# Patient Record
Sex: Male | Born: 1935 | Race: White | Hispanic: No | Marital: Married | Smoking: Never smoker
Health system: Southern US, Community
[De-identification: ages and names within clinical notes are randomized; demographics above are authoritative.]

## PROBLEM LIST (undated history)

## (undated) DIAGNOSIS — C801 Malignant (primary) neoplasm, unspecified: Secondary | ICD-10-CM

## (undated) DIAGNOSIS — I10 Essential (primary) hypertension: Secondary | ICD-10-CM

## (undated) DIAGNOSIS — I4891 Unspecified atrial fibrillation: Secondary | ICD-10-CM

## (undated) HISTORY — PX: APPENDECTOMY: SHX54

## (undated) HISTORY — PX: COLON SURGERY: SHX602

---

## 2012-11-28 ENCOUNTER — Emergency Department (HOSPITAL_BASED_OUTPATIENT_CLINIC_OR_DEPARTMENT_OTHER)
Admission: EM | Admit: 2012-11-28 | Discharge: 2012-11-29 | Disposition: A | Discharge status: Other Acute Inpatient Hospital | Payer: Medicare Other | Attending: Emergency Medicine | Admitting: Emergency Medicine

## 2012-11-28 ENCOUNTER — Emergency Department (HOSPITAL_BASED_OUTPATIENT_CLINIC_OR_DEPARTMENT_OTHER): Payer: Medicare Other

## 2012-11-28 ENCOUNTER — Encounter (HOSPITAL_BASED_OUTPATIENT_CLINIC_OR_DEPARTMENT_OTHER): Payer: Self-pay | Admitting: *Deleted

## 2012-11-28 DIAGNOSIS — I4891 Unspecified atrial fibrillation: Secondary | ICD-10-CM | POA: Insufficient documentation

## 2012-11-28 DIAGNOSIS — G459 Transient cerebral ischemic attack, unspecified: Secondary | ICD-10-CM

## 2012-11-28 DIAGNOSIS — I10 Essential (primary) hypertension: Secondary | ICD-10-CM | POA: Insufficient documentation

## 2012-11-28 DIAGNOSIS — Z79899 Other long term (current) drug therapy: Secondary | ICD-10-CM | POA: Insufficient documentation

## 2012-11-28 DIAGNOSIS — Z8589 Personal history of malignant neoplasm of other organs and systems: Secondary | ICD-10-CM | POA: Insufficient documentation

## 2012-11-28 HISTORY — DX: Unspecified atrial fibrillation: I48.91

## 2012-11-28 HISTORY — DX: Essential (primary) hypertension: I10

## 2012-11-28 HISTORY — DX: Malignant (primary) neoplasm, unspecified: C80.1

## 2012-11-28 LAB — CBC WITH DIFFERENTIAL/PLATELET
Basophils Absolute: 0 10*3/uL (ref 0.0–0.1)
HCT: 41.5 % (ref 39.0–52.0)
Hemoglobin: 14.1 g/dL (ref 13.0–17.0)
Lymphocytes Relative: 13 % (ref 12–46)
Lymphs Abs: 1.4 10*3/uL (ref 0.7–4.0)
Monocytes Absolute: 0.9 10*3/uL (ref 0.1–1.0)
Monocytes Relative: 8 % (ref 3–12)
Neutro Abs: 8.6 10*3/uL — ABNORMAL HIGH (ref 1.7–7.7)
RBC: 4.56 MIL/uL (ref 4.22–5.81)
WBC: 11.1 10*3/uL — ABNORMAL HIGH (ref 4.0–10.5)

## 2012-11-28 LAB — BASIC METABOLIC PANEL
BUN: 17 mg/dL (ref 6–23)
CO2: 26 mEq/L (ref 19–32)
Chloride: 97 mEq/L (ref 96–112)
Creatinine, Ser: 0.6 mg/dL (ref 0.50–1.35)
Glucose, Bld: 103 mg/dL — ABNORMAL HIGH (ref 70–99)

## 2012-11-28 LAB — PROTIME-INR: Prothrombin Time: 15.4 seconds — ABNORMAL HIGH (ref 11.6–15.2)

## 2012-11-28 MED ORDER — ENOXAPARIN SODIUM 80 MG/0.8ML ~~LOC~~ SOLN
1.0000 mg/kg | Freq: Once | SUBCUTANEOUS | Status: AC
  Administered 2012-11-28: 80 mg via SUBCUTANEOUS
  Filled 2012-11-28: qty 0.8

## 2012-11-28 MED ORDER — ASPIRIN 81 MG PO CHEW
324.0000 mg | CHEWABLE_TABLET | Freq: Once | ORAL | Status: AC
  Administered 2012-11-28: 324 mg via ORAL
  Filled 2012-11-28: qty 4

## 2012-11-28 NOTE — ED Notes (Signed)
Dr. Rulon Abide is in room speaking with the Pt. And the Pt. Daughter.  Pt. Is in no distress with controlled A Fib and stable VS.

## 2012-11-28 NOTE — ED Notes (Signed)
Pt. Has no neuro deficits and is alert and oriented. Pt. Speaking with Dr. At present time.

## 2012-11-28 NOTE — ED Notes (Signed)
Pt. Is 20/70 in R and L eye with out glasses.  Pt. Is 20/70 in R eye with glasses and is 20/50 in the L eye with no glasses.  Pt. Reports he had some trouble with reading his mail this morning. Pt. Daughter at bedside making the Pt. Angry.   Pt. Does live alone with reports that he does well.

## 2012-11-28 NOTE — ED Notes (Signed)
MD at bedside. 

## 2012-11-28 NOTE — ED Notes (Signed)
Family at bedside. 

## 2012-11-28 NOTE — ED Notes (Signed)
EDP Bonk is talking with physicians trying to get the Pt. Transferred to [facility] for further care due to Pt. Wants to go to [facility] and reports his Drs are there.

## 2012-11-28 NOTE — ED Notes (Signed)
Was taken off Coumadin for 2 days to get an injection for a pinched nerve. Since restarting his Coumadin, he has had visual disturbances and a slight headache.

## 2012-11-28 NOTE — ED Provider Notes (Signed)
History     CSN: [id number]  Arrival date & time 11/28/12  1749   First MD Initiated Contact with Patient 11/28/12 1822      No chief complaint on file.   (Consider location/radiation/quality/duration/timing/severity/associated sxs/prior treatment) HPIBennie Adkins is a 78 y.o. male was taking Coumadin for atrial fibrillation who recently discontinued taking Coumadin because he had an epidural nerve block placed on the 18th for chronic back pain. Patient was not bridged with Lovenox, but just started taking his Coumadin 2 days ago. He had a total of 5 days off of Coumadin. The patient presents because today he experienced transient right sided vision loss. He picked up some letters out of the mailbox tried to read one and noted that he could not read the right side, he cover both of his eyes and had the same symptoms bilaterally. He denies any dysarthria, numbness or tingling, weakness. These symptoms of vision loss resolved spontaneously. They occurred a few times throughout the day. No history of stroke. Denies any chest pain, shortness of breath, abdominal pain, nausea vomiting or diarrhea, no fevers or chills, rash, arthralgias or myalgias. No recent illness  Past Medical History  Diagnosis Date  . Atrial fibrillation   . Hypertension   . Cancer     Past Surgical History  Procedure Date  . Colon surgery   . Appendectomy     No family history on file.  History  Substance Use Topics  . Smoking status: Never Smoker   . Smokeless tobacco: Not on file  . Alcohol Use: No      Review of Systems At least 10pt or greater review of systems completed and are negative except where specified in the HPI.  Allergies  Sulfa antibiotics  Home Medications   Current Outpatient Rx  Name  Route  Sig  Dispense  Refill  . ALLOPURINOL PO   Oral   Take by mouth.         Marland Kitchen LISINOPRIL PO   Oral   Take by mouth.         . METOPROLOL TARTRATE PO   Oral   Take by mouth.          . COUMADIN PO   Oral   Take by mouth.           BP 162/110  Pulse 89  Temp 98.2 F (36.8 C) (Oral)  Resp 20  SpO2 97%  Physical Exam  PHYSICAL EXAM: VITAL SIGNS:  . Filed Vitals:   11/28/12 1926 11/28/12 1927 11/28/12 2049 11/28/12 2354  BP: 170/88  166/78   Pulse: 80     Temp:    98.4 F (36.9 C)  TempSrc:      Resp: 16  16   Height:  5\' 10"  (1.778 m)    Weight:  176 lb (79.833 kg)    SpO2: 100%  100%    CONSTITUTIONAL: Awake, oriented, appears non-toxic HENT: Atraumatic, normocephalic, oral mucosa pink and moist, airway patent. Nares patent without drainage. External ears normal. EYES: Conjunctiva clear, EOMI, PERRLA NECK: Trachea midline, non-tender, supple CARDIOVASCULAR: Normal heart rate, Normal rhythm, No murmurs, rubs, gallops PULMONARY/CHEST: Clear to auscultation, no rhonchi, wheezes, or rales. Symmetrical breath sounds. CHEST WALL: No lesions. Non-tender. ABDOMINAL: Non-distended, soft, non-tender - no rebound or guarding.  BS normal. NEUROLOGIC: EN:IDPOEU fields intact. PERRLA, EOMI.  Facial sensation equal to light touch bilaterally.  Good muscle bulk in the masseter muscle and good lateral movement of the jaw.  Facial expressions  equal and good strength with smile/frown and puffed cheeks.  Hearing grossly intact to finger rub test.  Uvula, tongue are midline with no deviation. Symmetrical palate elevation.  Trapezius and SCM muscles are 5/5 strength bilaterally.   DTR: Brachioradialis, biceps, patellar, Achilles tendon reflexes 2+ bilaterally.  No clonus. Strength: 5/5 strength flexors and extensors in the upper and lower extremities.  Grip strength, finger adduction/abduction 5/5. Sensation: Sensation intact distally to light touch Cerebellar: No ataxia with walking or dysmetria with finger to nose, rapid alternating hand movements and heels to shin testing. EXTREMITIES: No clubbing, cyanosis, or edema SKIN: Warm, Dry, No erythema, No rash. Innumerable  actinic keratoses   ED Course  Procedures (including critical care time)  Labs Reviewed  CBC WITH DIFFERENTIAL - Abnormal; Notable for the following:    WBC 11.1 (*)     Neutrophils Relative 78 (*)     Neutro Abs 8.6 (*)     All other components within normal limits  BASIC METABOLIC PANEL - Abnormal; Notable for the following:    Glucose, Bld 103 (*)     GFR calc non Af Amer 90 (*)     All other components within normal limits  PROTIME-INR - Abnormal; Notable for the following:    Prothrombin Time 15.4 (*)     All other components within normal limits   Ct Head Wo Contrast  11/28/2012  *RADIOLOGY REPORT*  Clinical Data: Amaurosis fugax.  Left-sided headaches. Hypertension.  CT HEAD WITHOUT CONTRAST  Technique:  Contiguous axial images were obtained from the base of the skull through the vertex without contrast.  Comparison: None available.  Findings: Moderate generalized atrophy is present.  Mild generalized periventricular and subcortical white matter hypoattenuation is present bilaterally.  No acute cortical infarct, hemorrhage, mass lesion is present.  The ventricles are proportionate to the degree of atrophy.  No significant extra-axial fluid collection is present.  A heterogeneous mass lesion expands the left maxillary sinus extends into the alveolar ridge.  The paranasal sinuses and mastoid air cells are otherwise clear.  The osseous skull is intact. The extracranial soft tissues are within normal limits.  IMPRESSION:  1.  Moderate generalized atrophy and white matter disease.  This likely reflects the sequelae of chronic microvascular ischemia. 2.  No acute intracranial abnormality. 3.  Expansile heterogeneous calcified mass within the left maxillary sinus.  This is most compatible with a mucocele.  The lesion extends into the alveolar ridge of the left maxilla.   Original Report Authenticated By: Marin Roberts, M.D.      1. TIA (transient ischemic attack)       MDM   Oscar Adkins is a 78 y.o. male presenting with history suggestive of transient ischemic attacks. Patient worked up as such-patient is subtherapeutic on his Coumadin with an INR of 1.24. Patient was given aspirin in the emergency department and will be given Lovenox as well. Otherwise labs are unremarkable. CT is unremarkable for acute hemorrhage or intracranial abnormality. Patient is neurologically intact at this time neurovascularly stable. Discussed with Dr. Lowell Guitar in [facility] for transfer for workup of his TIAs. Patient is likely in a transient hypercoagulable state and do to Coumadin therapy.  Patient is stable for transfer.     Jones Skene, MD 11/29/12 [phone]

## 2012-11-28 NOTE — ED Notes (Signed)
Pt. Daughter at bedside saying "We need to get this show on the road".  Explained to Pt and Pt. Daughter that we are waiting on HP Dr. To return our call.  Pt in no distress.

## 2013-12-18 ENCOUNTER — Emergency Department (HOSPITAL_BASED_OUTPATIENT_CLINIC_OR_DEPARTMENT_OTHER)
Admission: EM | Admit: 2013-12-18 | Discharge: 2013-12-18 | Disposition: A | Discharge status: Home / Self Care | Payer: Medicare Other | Attending: Emergency Medicine | Admitting: Emergency Medicine

## 2013-12-18 ENCOUNTER — Emergency Department (HOSPITAL_BASED_OUTPATIENT_CLINIC_OR_DEPARTMENT_OTHER): Payer: Medicare Other

## 2013-12-18 ENCOUNTER — Encounter (HOSPITAL_BASED_OUTPATIENT_CLINIC_OR_DEPARTMENT_OTHER): Payer: Self-pay | Admitting: Emergency Medicine

## 2013-12-18 DIAGNOSIS — I10 Essential (primary) hypertension: Secondary | ICD-10-CM | POA: Insufficient documentation

## 2013-12-18 DIAGNOSIS — Z8709 Personal history of other diseases of the respiratory system: Secondary | ICD-10-CM | POA: Insufficient documentation

## 2013-12-18 DIAGNOSIS — I4891 Unspecified atrial fibrillation: Secondary | ICD-10-CM | POA: Insufficient documentation

## 2013-12-18 DIAGNOSIS — IMO0001 Reserved for inherently not codable concepts without codable children: Secondary | ICD-10-CM | POA: Insufficient documentation

## 2013-12-18 DIAGNOSIS — R531 Weakness: Secondary | ICD-10-CM

## 2013-12-18 DIAGNOSIS — R5381 Other malaise: Secondary | ICD-10-CM | POA: Insufficient documentation

## 2013-12-18 DIAGNOSIS — R51 Headache: Secondary | ICD-10-CM | POA: Insufficient documentation

## 2013-12-18 DIAGNOSIS — Z79899 Other long term (current) drug therapy: Secondary | ICD-10-CM | POA: Insufficient documentation

## 2013-12-18 DIAGNOSIS — Z859 Personal history of malignant neoplasm, unspecified: Secondary | ICD-10-CM | POA: Insufficient documentation

## 2013-12-18 DIAGNOSIS — IMO0002 Reserved for concepts with insufficient information to code with codable children: Secondary | ICD-10-CM | POA: Insufficient documentation

## 2013-12-18 DIAGNOSIS — Z7901 Long term (current) use of anticoagulants: Secondary | ICD-10-CM | POA: Insufficient documentation

## 2013-12-18 DIAGNOSIS — I959 Hypotension, unspecified: Secondary | ICD-10-CM

## 2013-12-18 DIAGNOSIS — I951 Orthostatic hypotension: Secondary | ICD-10-CM | POA: Insufficient documentation

## 2013-12-18 DIAGNOSIS — Z880 Allergy status to penicillin: Secondary | ICD-10-CM | POA: Insufficient documentation

## 2013-12-18 DIAGNOSIS — R5383 Other fatigue: Secondary | ICD-10-CM

## 2013-12-18 LAB — URINALYSIS, ROUTINE W REFLEX MICROSCOPIC
BILIRUBIN URINE: NEGATIVE
GLUCOSE, UA: NEGATIVE mg/dL
HGB URINE DIPSTICK: NEGATIVE
Ketones, ur: NEGATIVE mg/dL
Leukocytes, UA: NEGATIVE
Nitrite: NEGATIVE
PROTEIN: NEGATIVE mg/dL
Specific Gravity, Urine: 1.009 (ref 1.005–1.030)
Urobilinogen, UA: 0.2 mg/dL (ref 0.0–1.0)
pH: 5.5 (ref 5.0–8.0)

## 2013-12-18 LAB — BASIC METABOLIC PANEL
BUN: 60 mg/dL — AB (ref 6–23)
CALCIUM: 9.2 mg/dL (ref 8.4–10.5)
CO2: 36 meq/L — AB (ref 19–32)
Chloride: 92 mEq/L — ABNORMAL LOW (ref 96–112)
Creatinine, Ser: 1.3 mg/dL (ref 0.50–1.35)
GFR calc Af Amer: 56 mL/min — ABNORMAL LOW (ref >90)
GFR calc non Af Amer: 48 mL/min — ABNORMAL LOW (ref >90)
GLUCOSE: 111 mg/dL — AB (ref 70–99)
Potassium: 4.9 mEq/L (ref 3.7–5.3)
SODIUM: 138 meq/L (ref 137–147)

## 2013-12-18 LAB — CBC WITH DIFFERENTIAL/PLATELET
BASOS PCT: 1 % (ref 0–1)
Basophils Absolute: 0.1 10*3/uL (ref 0.0–0.1)
EOS ABS: 0.1 10*3/uL (ref 0.0–0.7)
Eosinophils Relative: 1 % (ref 0–5)
HEMATOCRIT: 34.9 % — AB (ref 39.0–52.0)
HEMOGLOBIN: 10.8 g/dL — AB (ref 13.0–17.0)
LYMPHS ABS: 1.6 10*3/uL (ref 0.7–4.0)
LYMPHS PCT: 17 % (ref 12–46)
MCH: 27.2 pg (ref 26.0–34.0)
MCHC: 30.9 g/dL (ref 30.0–36.0)
MCV: 87.9 fL (ref 78.0–100.0)
MONO ABS: 1.2 10*3/uL — AB (ref 0.1–1.0)
Monocytes Relative: 13 % — ABNORMAL HIGH (ref 3–12)
NEUTROS ABS: 6.4 10*3/uL (ref 1.7–7.7)
Neutrophils Relative %: 68 % (ref 43–77)
Platelets: 234 10*3/uL (ref 150–400)
RBC: 3.97 MIL/uL — ABNORMAL LOW (ref 4.22–5.81)
RDW: 18 % — AB (ref 11.5–15.5)
WBC: 9.4 10*3/uL (ref 4.0–10.5)

## 2013-12-18 LAB — DIGOXIN LEVEL: DIGOXIN LVL: 0.6 ng/mL — AB (ref 0.8–2.0)

## 2013-12-18 LAB — TROPONIN I: Troponin I: <0.3 ng/mL (ref <0.30)

## 2013-12-18 LAB — PROTIME-INR
INR: 1.25 (ref 0.00–1.49)
PROTHROMBIN TIME: 15.4 s — AB (ref 11.6–15.2)

## 2013-12-18 LAB — OCCULT BLOOD X 1 CARD TO LAB, STOOL: Fecal Occult Bld: NEGATIVE

## 2013-12-18 MED ORDER — SODIUM CHLORIDE 0.9 % IV BOLUS (SEPSIS)
500.0000 mL | Freq: Once | INTRAVENOUS | Status: AC
  Administered 2013-12-18: 500 mL via INTRAVENOUS

## 2013-12-18 NOTE — ED Provider Notes (Signed)
CSN: [id number]     Arrival date & time 12/18/13  1505 History   First MD Initiated Contact with Patient 12/18/13 1510     Chief Complaint  Patient presents with  . Hypotension   (Consider location/radiation/quality/duration/timing/severity/associated sxs/prior Treatment) HPI Patient with history of afib living in LTCF reports several days of intermittent headache and general weakness. Found to by hypotensive at facility yesterday which was persistent today. No fever. No falls or injuries. Pt reports vague mild aching pain 'all over' but no specific chest pain or SOB. He was recently treated for Bronchitis. Denies any vomiting, diarrhea or dysuria. No bloody or melanic stools.   Past Medical History  Diagnosis Date  . Atrial fibrillation   . Hypertension   . Cancer    Past Surgical History  Procedure Laterality Date  . Colon surgery    . Appendectomy     History reviewed. No pertinent family history. History  Substance Use Topics  . Smoking status: Never Smoker   . Smokeless tobacco: Not on file  . Alcohol Use: No    Review of Systems All other systems reviewed and are negative except as noted in HPI.   Allergies  Codeine; Erythromycin; Penicillins; and Sulfa antibiotics  Home Medications   Current Outpatient Rx  Name  Route  Sig  Dispense  Refill  . digoxin (LANOXIN) 0.125 MG tablet   Oral   Take by mouth daily.         Marland Kitchen escitalopram (LEXAPRO) 20 MG tablet   Oral   Take 20 mg by mouth daily.         . finasteride (PROSCAR) 5 MG tablet   Oral   Take 5 mg by mouth daily.         . fluticasone (FLOVENT HFA) 110 MCG/ACT inhaler   Inhalation   Inhale into the lungs 2 (two) times daily.         . furosemide (LASIX) 80 MG tablet   Oral   Take 80 mg by mouth.         . hydrocortisone (ANUSOL-HC) 2.5 % rectal cream   Rectal   Place 1 application rectally 2 (two) times daily.         . isosorbide mononitrate (IMDUR) 60 MG 24 hr tablet   Oral   Take  60 mg by mouth daily.         Marland Kitchen loratadine (CLARITIN) 10 MG tablet   Oral   Take 10 mg by mouth daily.         Marland Kitchen LORazepam (ATIVAN) 1 MG tablet   Oral   Take 1 mg by mouth every 8 (eight) hours.         . metolazone (ZAROXOLYN) 2.5 MG tablet   Oral   Take 2.5 mg by mouth daily.         . potassium chloride (K-DUR) 10 MEQ tablet   Oral   Take 10 mEq by mouth daily.         . Rivaroxaban (XARELTO) 15 MG TABS tablet   Oral   Take 15 mg by mouth 2 (two) times daily with a meal.         . spironolactone (ALDACTONE) 25 MG tablet   Oral   Take 25 mg by mouth daily.         . ALLOPURINOL PO   Oral   Take by mouth.         Marland Kitchen LISINOPRIL PO   Oral  Take by mouth.         . METOPROLOL TARTRATE PO   Oral   Take by mouth.         . Warfarin Sodium (COUMADIN PO)   Oral   Take by mouth.          BP 103/58  Pulse 82  Temp(Src) 97.9 F (36.6 C) (Oral)  Resp 22  Ht 6' (1.829 m)  Wt 200 lb (90.719 kg)  BMI 27.12 kg/m2  SpO2 95% Physical Exam  Nursing note and vitals reviewed. Constitutional: He is oriented to person, place, and time. He appears well-developed and well-nourished.  HENT:  Head: Normocephalic and atraumatic.  Eyes: EOM are normal. Pupils are equal, round, and reactive to light.  Neck: Normal range of motion. Neck supple.  Cardiovascular: Normal rate, normal heart sounds and intact distal pulses.   Pulmonary/Chest: Effort normal and breath sounds normal.  Abdominal: Bowel sounds are normal. He exhibits no distension. There is no tenderness.  Musculoskeletal: Normal range of motion. He exhibits no edema and no tenderness.  Neurological: He is alert and oriented to person, place, and time. He has normal strength. No cranial nerve deficit or sensory deficit.  Skin: Skin is warm and dry. No rash noted.  Psychiatric: He has a normal mood and affect.    ED Course  Procedures (including critical care time) Labs Review Labs Reviewed  CBC  WITH DIFFERENTIAL - Abnormal; Notable for the following:    RBC 3.97 (*)    Hemoglobin 10.8 (*)    HCT 34.9 (*)    RDW 18.0 (*)    Monocytes Relative 13 (*)    Monocytes Absolute 1.2 (*)    All other components within normal limits  BASIC METABOLIC PANEL - Abnormal; Notable for the following:    Chloride 92 (*)    CO2 36 (*)    Glucose, Bld 111 (*)    BUN 60 (*)    GFR calc non Af Amer 48 (*)    GFR calc Af Amer 56 (*)    All other components within normal limits  PROTIME-INR - Abnormal; Notable for the following:    Prothrombin Time 15.4 (*)    All other components within normal limits  DIGOXIN LEVEL - Abnormal; Notable for the following:    Digoxin Level 0.6 (*)    All other components within normal limits  URINALYSIS, ROUTINE W REFLEX MICROSCOPIC  TROPONIN I  OCCULT BLOOD X 1 CARD TO LAB, STOOL   Imaging Review Ct Head Wo Contrast  12/18/2013   CLINICAL DATA:  Headache, weakness, hypotension  EXAM: CT HEAD WITHOUT CONTRAST  TECHNIQUE: Contiguous axial images were obtained from the base of the skull through the vertex without intravenous contrast.  COMPARISON:  08/21/2013  FINDINGS: No skull fracture is noted. Again noted partition inferior aspect of the left maxillary sinus with air-fluid level. This is unchanged from prior exam. The mastoid air cells are unremarkable.  Stable cerebral atrophy. Stable periventricular and patchy subcortical chronic white matter disease. No acute cortical infarction. No mass lesion is noted on this unenhanced scan.  No intracranial hemorrhage, mass effect or midline shift.  IMPRESSION: No acute intracranial abnormality. Stable atrophy and chronic white matter disease.   Electronically Signed   By: Natasha Mead M.D.   On: 12/18/2013 16:03    EKG Interpretation   None       MDM   1. General weakness   2. Hypotension     Labs  and imaging reviewed. Decrease in Hgb from Dec2013 but family states he has had anemia more recently than that. Hemoccult  neg. BP improved with 500cc IVF bolus. Will ambulate and recheck.   Pt walks without difficulty, feeling better. No signs of acute blood loss, sepsis or other source of shock. Advised to hold lisinopril until PCP followup next week.    Archit Leger B. Karle Starch, MD 12/18/13 [phone]

## 2013-12-18 NOTE — ED Notes (Signed)
Brought in by EMS from [facility] for low BP and generalized weakness

## 2013-12-18 NOTE — ED Notes (Signed)
MD at bedside. 

## 2013-12-18 NOTE — Discharge Instructions (Signed)
Hypotension As your heart beats, it forces blood through your arteries. This force is your blood pressure. If your blood pressure is too low for you to go about your normal activities or support the organs of your body, you have hypotension, or low blood pressure. When your blood pressure becomes too low, you may not get enough blood to your brain, and you may feel weak, lightheaded, or develop a more rapid heart rate. In a more severe case, you may faint: this is a sudden, brief loss of consciousness where you pass out and recover completely. CAUSES  Loss of blood or fluids from the body. This occurs during rapid blood loss. It can also come from dehydration when the body is not taking in enough fluids or is losing fluids faster than they can be replaced. Examples of this would be severe vomiting and diarrhea.  Not taking in enough fluids and salts. This is common in the elderly where thirst mechanisms are not working as well. This means you do not feel thirsty and you do not take in enough water.  Use of blood pressure pills and other medications that may lower the blood pressure below normal.  Over medication (always take your medications as directed).  Irregular heart beat or heart failure when the heart is no longer working well enough to support blood pressure. Hospitalization is sometimes required for low blood pressure if fluid or blood replacement is needed, if time is needed for medications to wear off, or if further evaluation is needed. Less common causes of low blood pressure might include peripheral or autonomic neuropathy (nerve problems), Parkinson's disease, or other illnesses. Treatment might include a change in diet, change in medications (including medicines aimed at raising your blood pressure), and use of support stockings. HOME CARE INSTRUCTIONS   Maintain good fluid intake and use a little more salt on your food (if you are not on a restricted diet or having problems with your  heart such as heart failure). This is especially important for the elderly when you may not feel thirsty in the winter.  Take your medications as directed.  Get up slowly from reclining or sitting positions. This gives your blood pressure a chance to adjust. As we grow older our ability to regulate our blood pressure may not be as good as when we were younger.  Wear support stockings if prescribed.  Use walkers, canes, etc., if advised.  Talk with your physician or nurse about a Home Safety Evaluation (usually done by visiting nurses). SEEK IMMEDIATE MEDICAL CARE IF:   You have a fainting episode. Do not drive yourself. Call 911 if no other help is available.  You have chest pain, nausea (feeling sick to your stomach) or vomiting.  You have a loss of feeling in some part of your body, or lose movement in your arms or legs.  You have difficulty with speech.  You become sweaty and/or feel light headed. Make sure you are re-checked as instructed. MAKE SURE YOU:   Understand these instructions.  Will watch your condition.  Will get help right away if you are not doing well or get worse. Document Released: 11/26/2005 Document Revised: 02/18/2012 Document Reviewed: 05/29/2013 [employer] Patient Information 2014 [employer].  

## 2014-04-06 IMAGING — CT CT HEAD W/O CM
1 series · 15 of 30 positions shown, 19 images · non-contrast
Comparison: None available.

CLINICAL DATA: Amaurosis fugax.  Left-sided headaches.
Hypertension.

CT HEAD WITHOUT CONTRAST
TECHNIQUE: Contiguous axial images were obtained from the base of
the skull through the vertex without contrast.

[Series 2: head 4.8 h37s · axial · 0.46mm/px · z∈[-162,-6]mm · 15 of 36 slices shown, 19 images]
[im 2/36  brain]
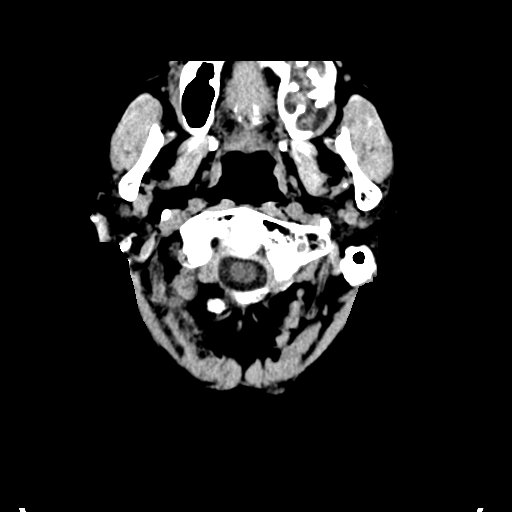
[im 2/36  bone]
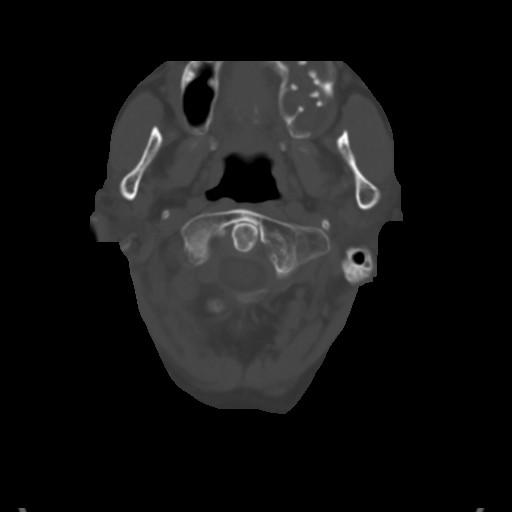
[im 4/36  brain]
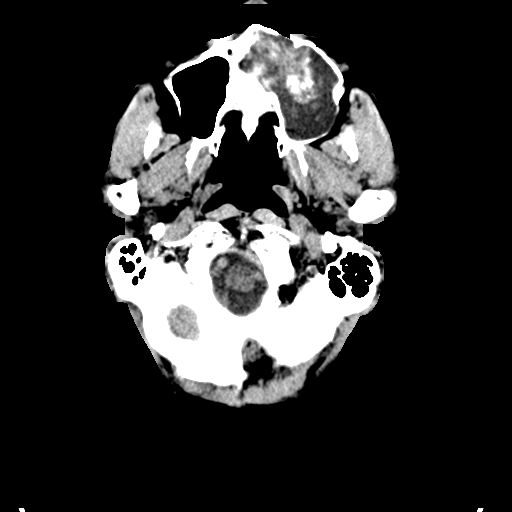
[im 7/36  brain]
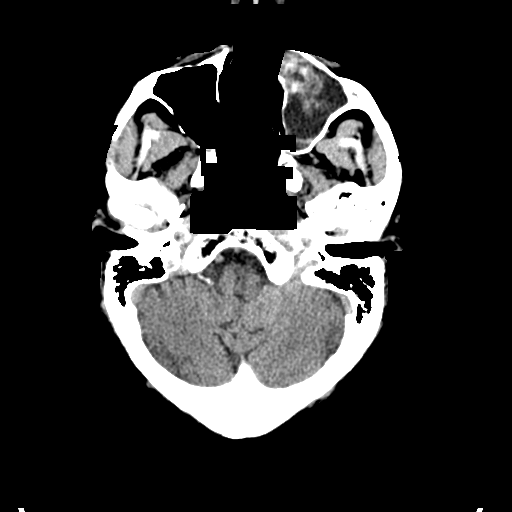
[im 9/36  brain]
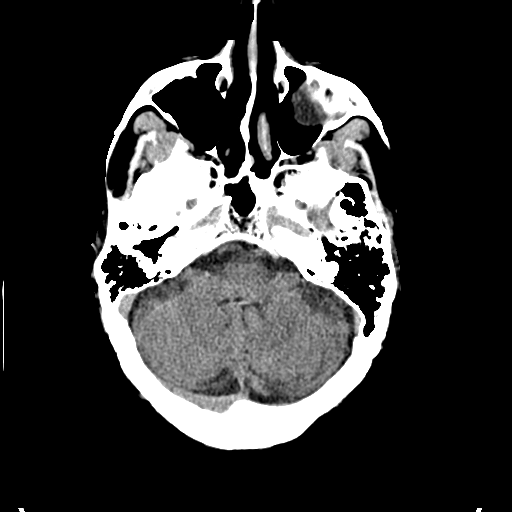
[im 11/36  brain]
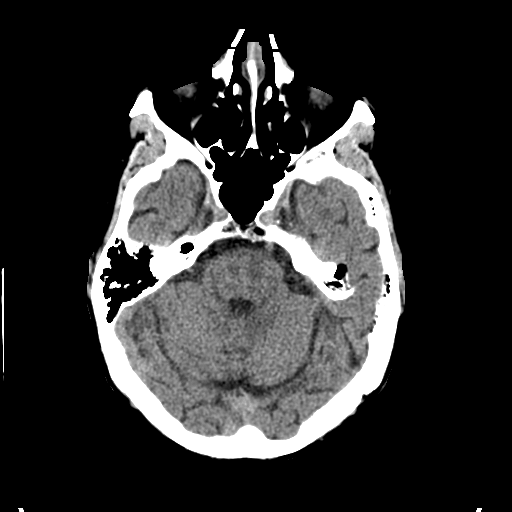
[im 11/36  bone]
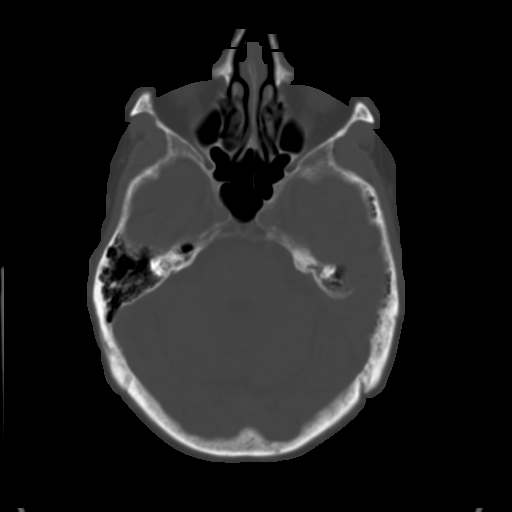
[im 14/36  brain]
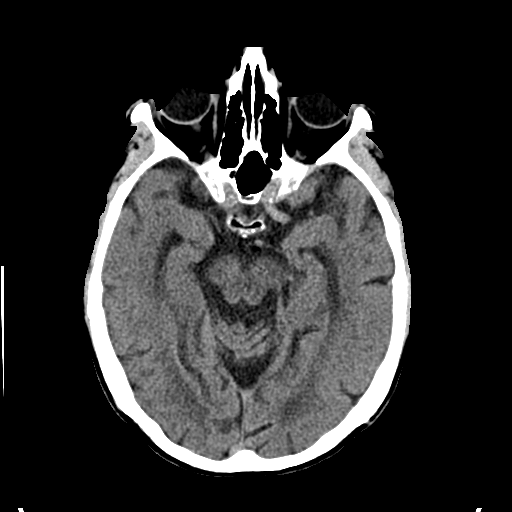
[im 16/36  brain]
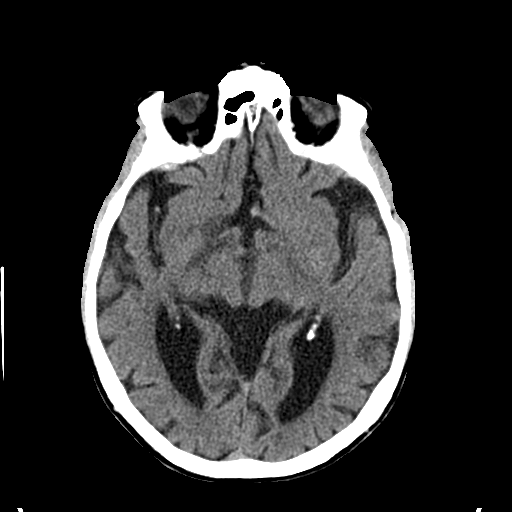
[im 19/36  brain]
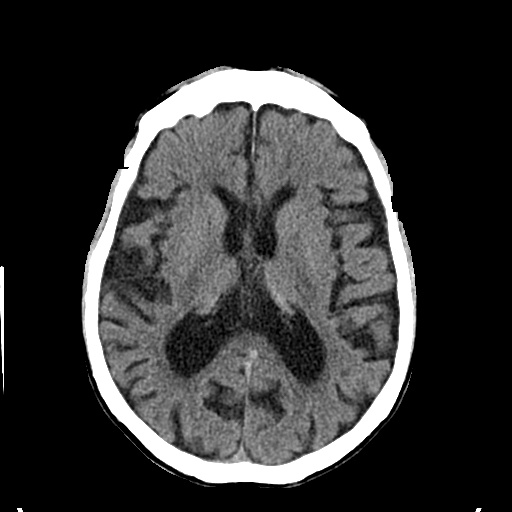
[im 20/36  brain]
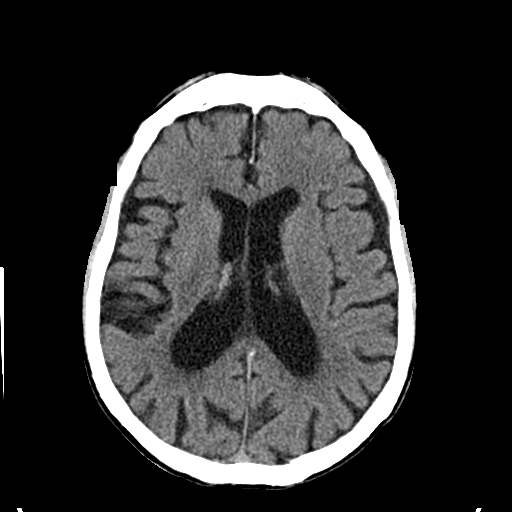
[im 20/36  bone]
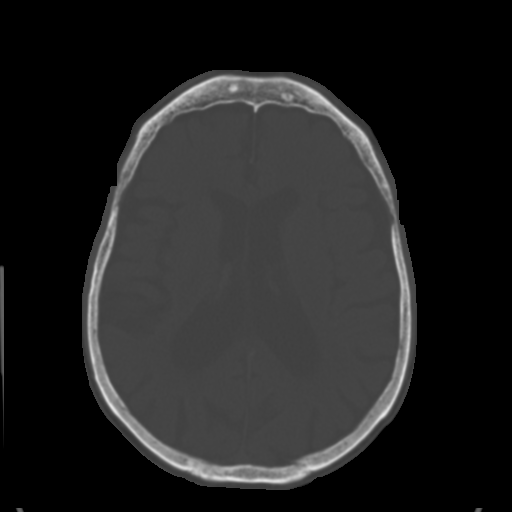
[im 22/36  brain]
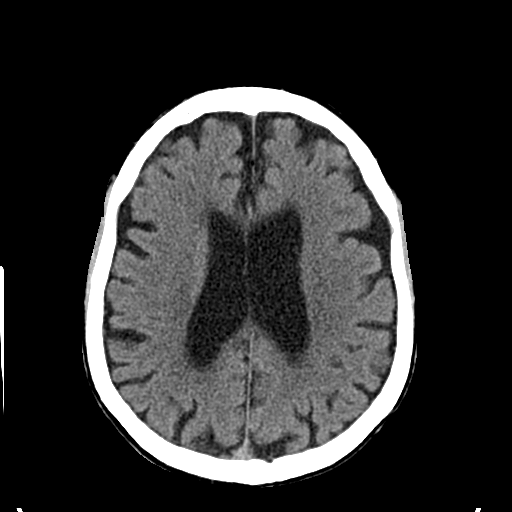
[im 25/36  brain]
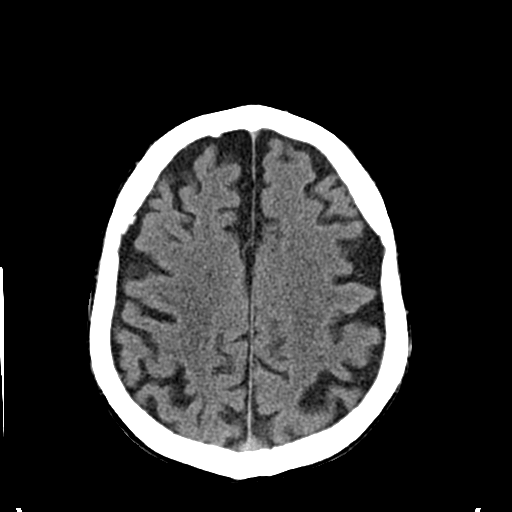
[im 27/36  brain]
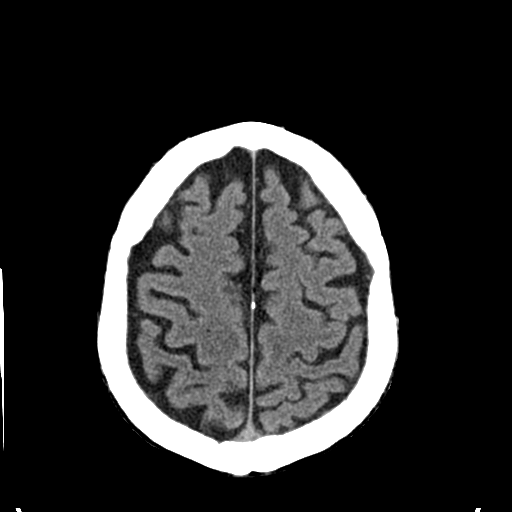
[im 29/36  brain]
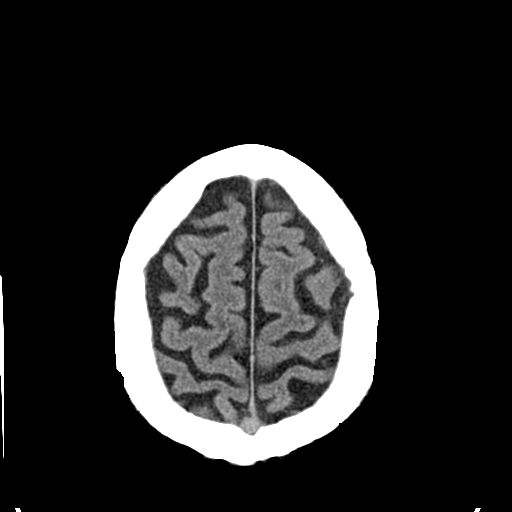
[im 29/36  bone]
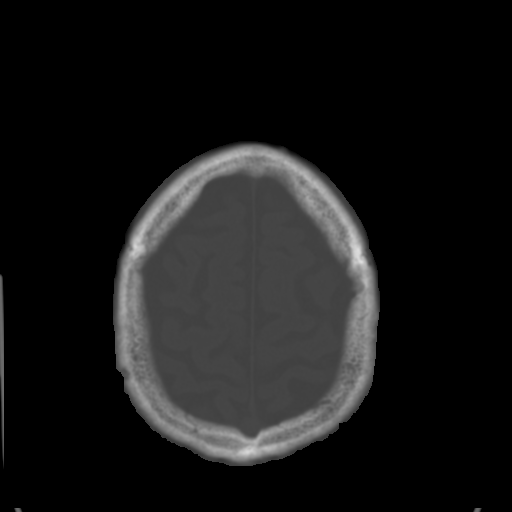
[im 32/36  brain]
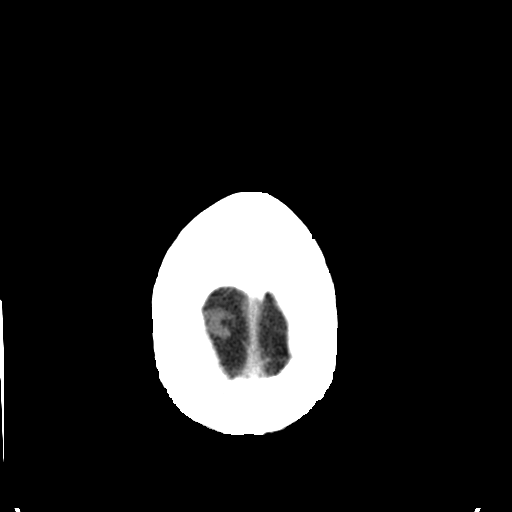
[im 34/36  brain]
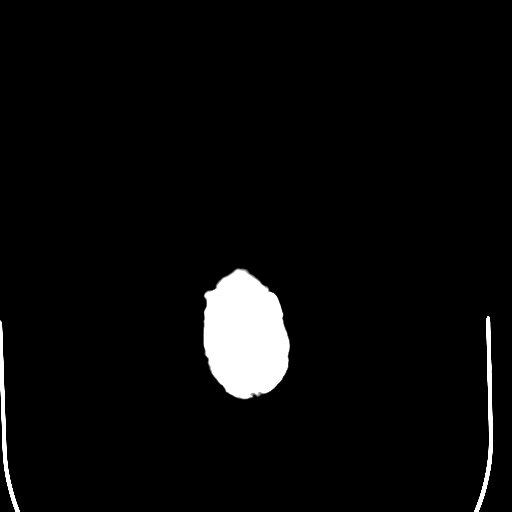

[15 of 30 positions shown; findings below may reference images not displayed]

FINDINGS: Moderate generalized atrophy is present.  Mild
generalized periventricular and subcortical white matter
hypoattenuation is present bilaterally.  No acute cortical infarct,
hemorrhage, mass lesion is present.  The ventricles are
proportionate to the degree of atrophy.  No significant extra-axial
fluid collection is present.

A heterogeneous mass lesion expands the left maxillary sinus
extends into the alveolar ridge.  The paranasal sinuses and mastoid
air cells are otherwise clear.  The osseous skull is intact. The
extracranial soft tissues are within normal limits.
IMPRESSION: 1.  Moderate generalized atrophy and white matter disease.  This
likely reflects the sequelae of chronic microvascular ischemia.
2.  No acute intracranial abnormality.
3.  Expansile heterogeneous calcified mass within the left
maxillary sinus.  This is most compatible with a mucocele.  The
lesion extends into the alveolar ridge of the left maxilla.

## 2014-04-09 DEATH — deceased

## 2015-04-26 IMAGING — CT CT HEAD W/O CM
1 series · 16 of 30 positions shown, 20 images · non-contrast
Comparison: 08/21/2013

CLINICAL DATA: Headache, weakness, hypotension

EXAM:
CT HEAD WITHOUT CONTRAST
TECHNIQUE: Contiguous axial images were obtained from the base of the skull
through the vertex without intravenous contrast.

[Series 2: head 4.8 h37s · axial · 0.45mm/px · z∈[-162,-6]mm · 16 of 36 slices shown, 20 images]
[im 2/36  brain]
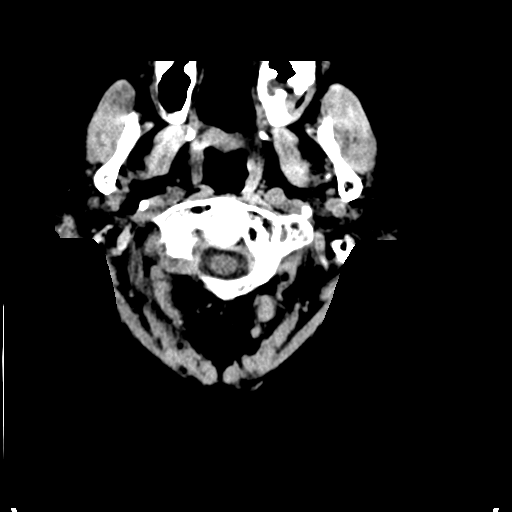
[im 2/36  bone]
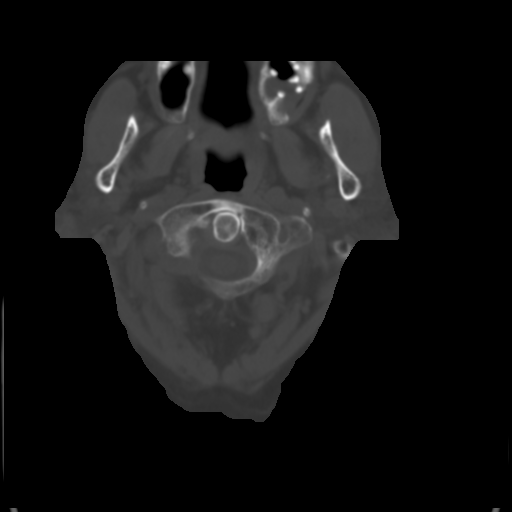
[im 4/36  brain]
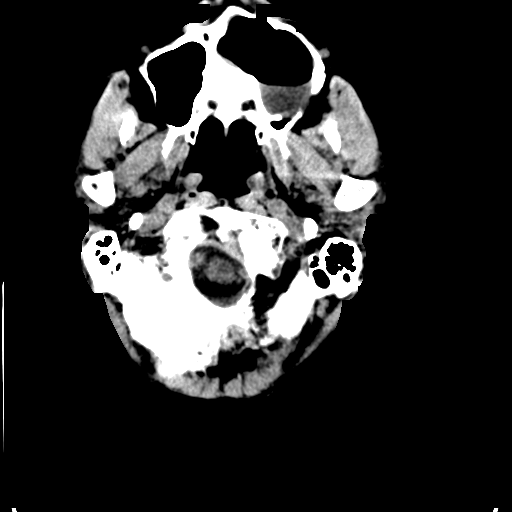
[im 7/36  brain]
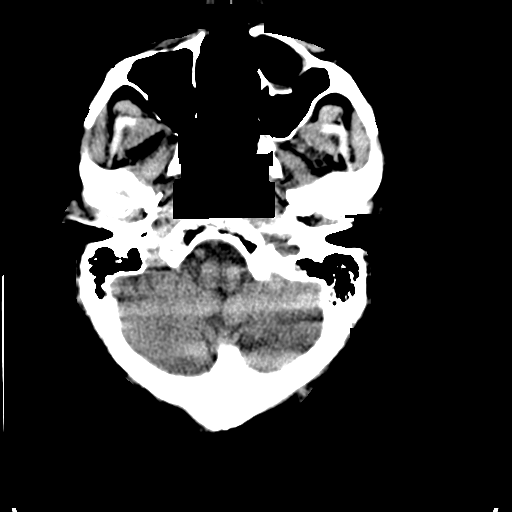
[im 9/36  brain]
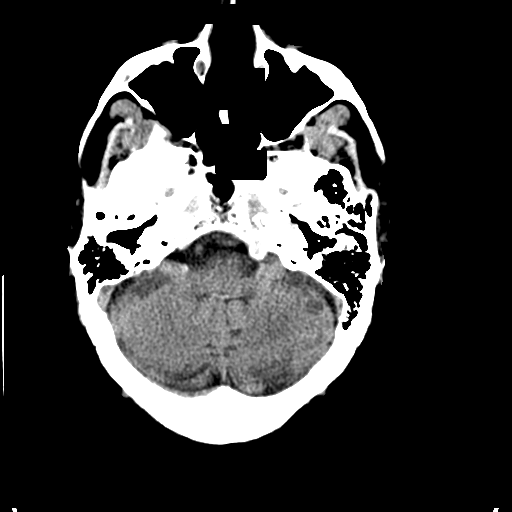
[im 10/36  brain]
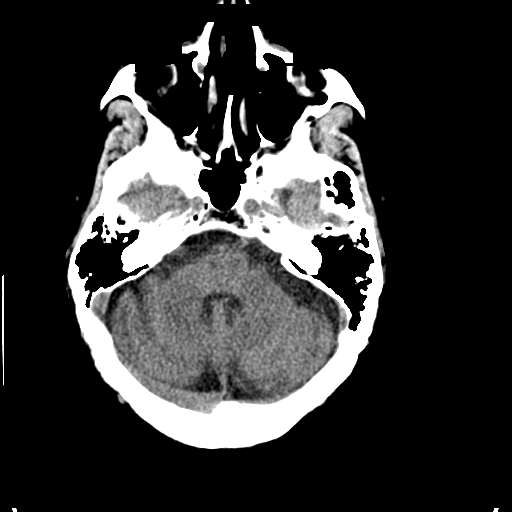
[im 10/36  bone]
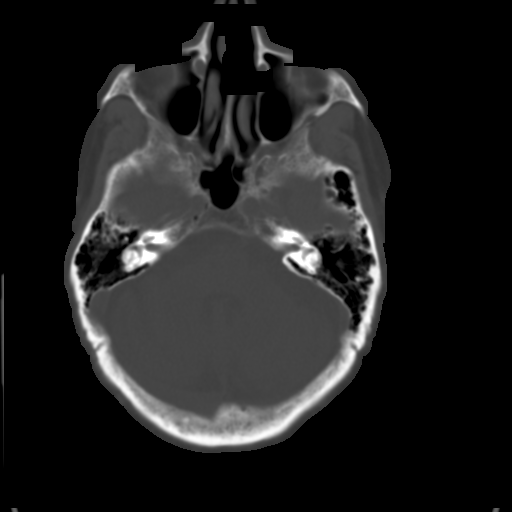
[im 13/36  brain]
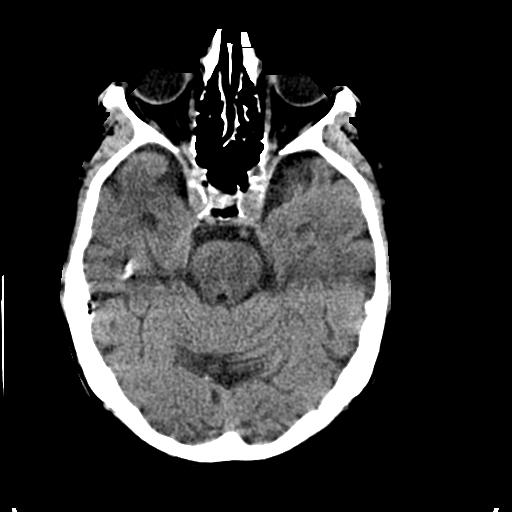
[im 15/36  brain]
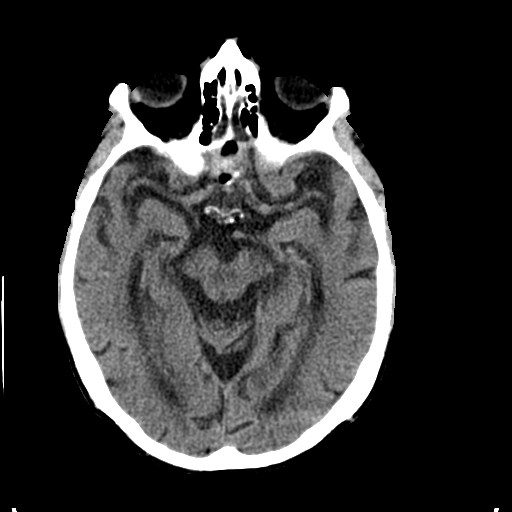
[im 17/36  brain]
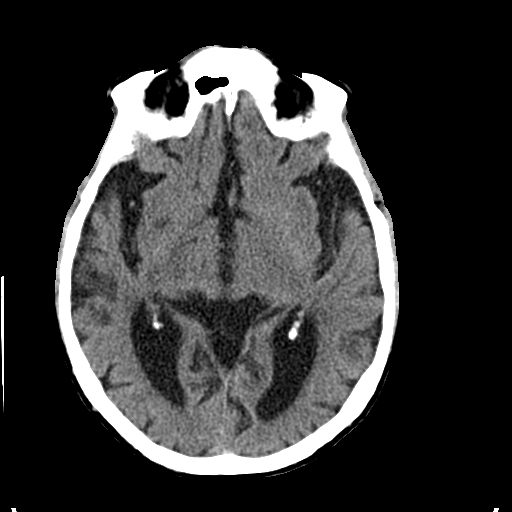
[im 19/36  brain]
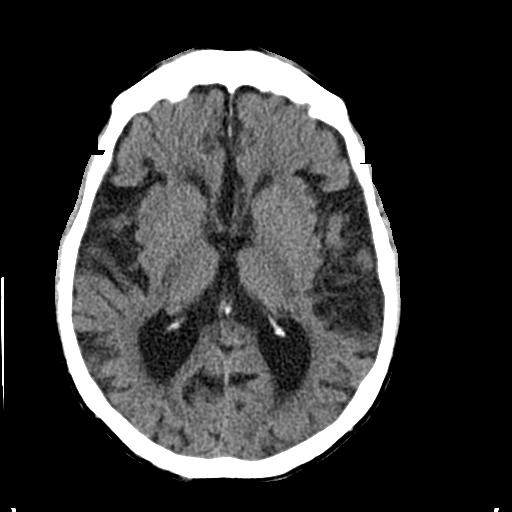
[im 19/36  bone]
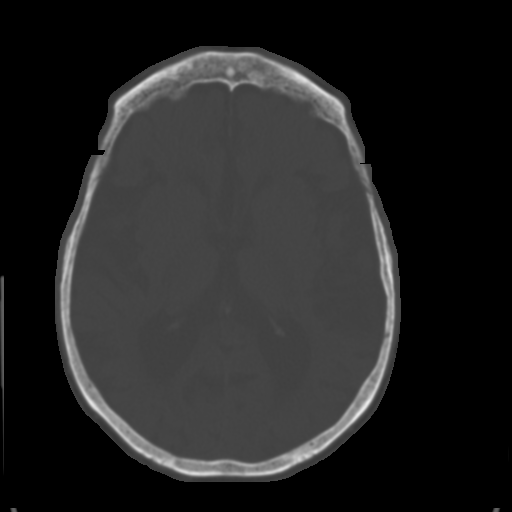
[im 21/36  brain]
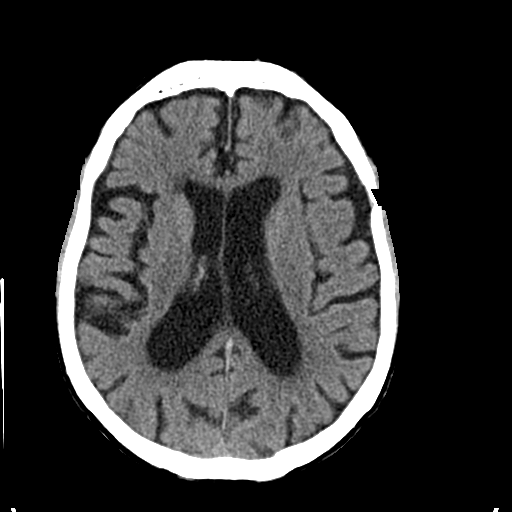
[im 23/36  brain]
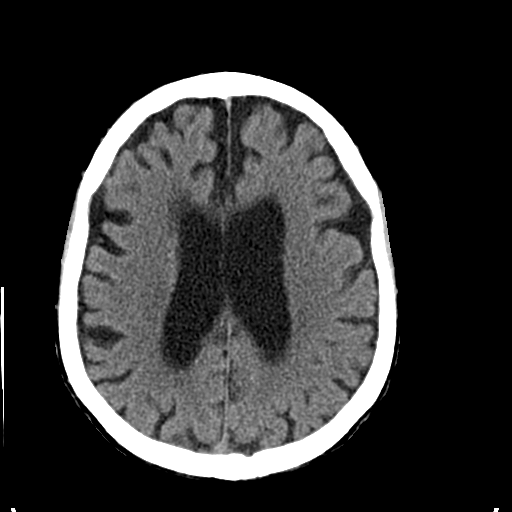
[im 26/36  brain]
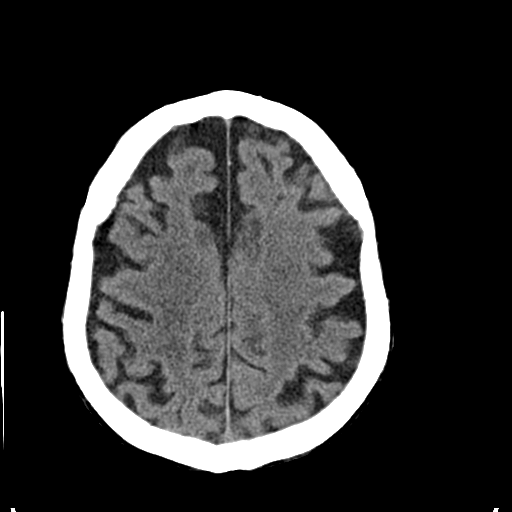
[im 27/36  brain]
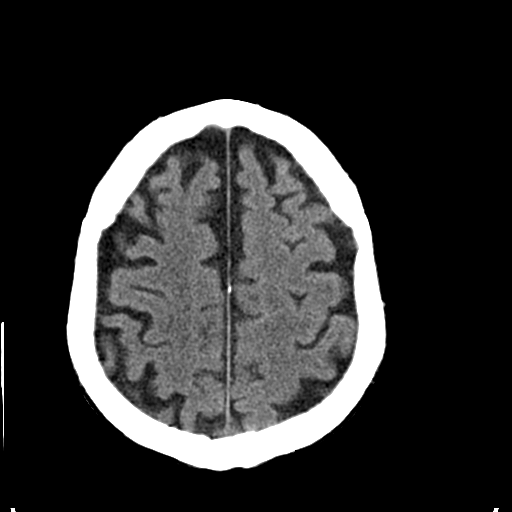
[im 27/36  bone]
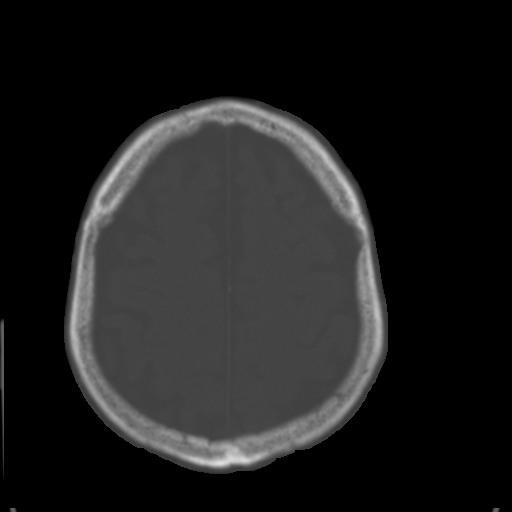
[im 29/36  brain]
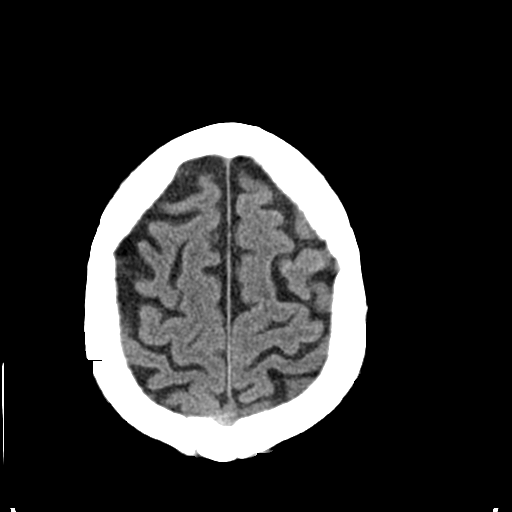
[im 32/36  brain]
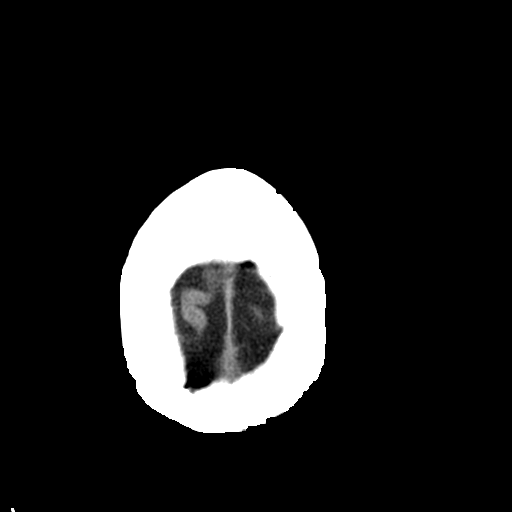
[im 34/36  brain]
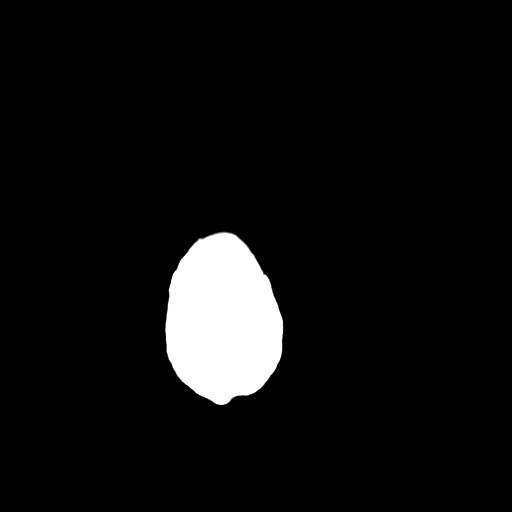

[16 of 30 positions shown; findings below may reference images not displayed]

FINDINGS: No skull fracture is noted. Again noted partition inferior aspect of
the left maxillary sinus with air-fluid level. This is unchanged
from prior exam. The mastoid air cells are unremarkable.

Stable cerebral atrophy. Stable periventricular and patchy
subcortical chronic white matter disease. No acute cortical
infarction. No mass lesion is noted on this unenhanced scan.

No intracranial hemorrhage, mass effect or midline shift.
IMPRESSION: No acute intracranial abnormality. Stable atrophy and chronic white
matter disease.
